# Patient Record
Sex: Female | Born: 1990 | Race: White | Hispanic: No | Marital: Single
Health system: Southern US, Community
[De-identification: ages and names within clinical notes are randomized; demographics above are authoritative.]

---
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
## 2008-11-21 ENCOUNTER — Ambulatory Visit: Payer: Self-pay | Admitting: General Surgery

## 2008-11-30 ENCOUNTER — Ambulatory Visit: Payer: Self-pay | Admitting: General Surgery

## 2008-12-30 ENCOUNTER — Emergency Department: Payer: Self-pay | Admitting: Emergency Medicine

## 2009-01-11 ENCOUNTER — Emergency Department: Payer: Self-pay

## 2009-04-16 ENCOUNTER — Emergency Department: Payer: Self-pay | Admitting: Emergency Medicine

## 2009-04-25 ENCOUNTER — Emergency Department: Payer: Self-pay | Admitting: Emergency Medicine

## 2012-09-22 ENCOUNTER — Emergency Department: Payer: Self-pay | Admitting: Emergency Medicine

## 2012-09-22 LAB — COMPREHENSIVE METABOLIC PANEL
Albumin: 3.2 g/dL — ABNORMAL LOW (ref 3.4–5.0)
Alkaline Phosphatase: 51 U/L (ref 50–136)
Anion Gap: 10 (ref 7–16)
Bilirubin,Total: 0.2 mg/dL (ref 0.2–1.0)
Calcium, Total: 9.1 mg/dL (ref 8.5–10.1)
Chloride: 109 mmol/L — ABNORMAL HIGH (ref 98–107)
Co2: 20 mmol/L — ABNORMAL LOW (ref 21–32)
Creatinine: 0.75 mg/dL (ref 0.60–1.30)
EGFR (African American): >60
EGFR (Non-African Amer.): >60
Glucose: 120 mg/dL — ABNORMAL HIGH (ref 65–99)
Osmolality: 281 (ref 275–301)
Potassium: 3.9 mmol/L (ref 3.5–5.1)
SGOT(AST): 18 U/L (ref 15–37)

## 2012-09-22 LAB — CBC
HCT: 39.4 % (ref 35.0–47.0)
HGB: 13.6 g/dL (ref 12.0–16.0)
MCH: 33.5 pg (ref 26.0–34.0)
MCHC: 34.5 g/dL (ref 32.0–36.0)
MCV: 97 fL (ref 80–100)
RBC: 4.05 10*6/uL (ref 3.80–5.20)

## 2012-09-22 LAB — URINALYSIS, COMPLETE
Bilirubin,UR: NEGATIVE
Blood: NEGATIVE
Glucose,UR: NEGATIVE mg/dL (ref 0–75)
Ketone: NEGATIVE
Nitrite: NEGATIVE
Ph: 6 (ref 4.5–8.0)
Protein: NEGATIVE
Squamous Epithelial: <1
WBC UR: 1 /HPF (ref 0–5)

## 2012-09-22 LAB — DRUG SCREEN, URINE
Cannabinoid 50 Ng, Ur ~~LOC~~: NEGATIVE (ref ?–50)
Cocaine Metabolite,Ur ~~LOC~~: NEGATIVE (ref ?–300)
MDMA (Ecstasy)Ur Screen: NEGATIVE (ref ?–500)
Opiate, Ur Screen: NEGATIVE (ref ?–300)
Tricyclic, Ur Screen: NEGATIVE (ref ?–1000)

## 2012-09-22 LAB — PREGNANCY, URINE: Pregnancy Test, Urine: NEGATIVE m[IU]/mL

## 2012-09-23 LAB — ETHANOL
Ethanol %: <0.003 % (ref 0.000–0.080)
Ethanol: <3 mg/dL

## 2012-10-09 ENCOUNTER — Emergency Department: Payer: Self-pay | Admitting: Emergency Medicine

## 2012-10-10 ENCOUNTER — Emergency Department: Payer: Self-pay | Admitting: Internal Medicine

## 2012-10-10 LAB — URINALYSIS, COMPLETE
Bacteria: NONE SEEN
Bilirubin,UR: NEGATIVE
Blood: NEGATIVE
Glucose,UR: NEGATIVE mg/dL (ref 0–75)
Leukocyte Esterase: NEGATIVE
Nitrite: NEGATIVE
Ph: 7 (ref 4.5–8.0)
Protein: NEGATIVE
RBC,UR: 1 /HPF (ref 0–5)
Squamous Epithelial: NONE SEEN

## 2012-10-10 LAB — DRUG SCREEN, URINE
Amphetamines, Ur Screen: NEGATIVE (ref ?–1000)
Barbiturates, Ur Screen: NEGATIVE (ref ?–200)
Benzodiazepine, Ur Scrn: NEGATIVE (ref ?–200)
MDMA (Ecstasy)Ur Screen: NEGATIVE (ref ?–500)
Methadone, Ur Screen: NEGATIVE (ref ?–300)
Opiate, Ur Screen: NEGATIVE (ref ?–300)
Tricyclic, Ur Screen: NEGATIVE (ref ?–1000)

## 2012-10-10 LAB — ETHANOL: Ethanol: <3 mg/dL

## 2012-10-10 LAB — COMPREHENSIVE METABOLIC PANEL
Alkaline Phosphatase: 60 U/L (ref 50–136)
Anion Gap: 10 (ref 7–16)
BUN: 14 mg/dL (ref 7–18)
Bilirubin,Total: 0.4 mg/dL (ref 0.2–1.0)
Calcium, Total: 9.1 mg/dL (ref 8.5–10.1)
Chloride: 112 mmol/L — ABNORMAL HIGH (ref 98–107)
Co2: 17 mmol/L — ABNORMAL LOW (ref 21–32)
EGFR (African American): >60
Glucose: 102 mg/dL — ABNORMAL HIGH (ref 65–99)
Potassium: 4.5 mmol/L (ref 3.5–5.1)
SGOT(AST): 36 U/L (ref 15–37)
Sodium: 139 mmol/L (ref 136–145)

## 2012-10-10 LAB — SALICYLATE LEVEL: Salicylates, Serum: <1.7 mg/dL

## 2012-10-14 ENCOUNTER — Emergency Department: Payer: Self-pay | Admitting: Emergency Medicine

## 2012-10-14 LAB — CBC
HCT: 39.1 % (ref 35.0–47.0)
MCH: 33.1 pg (ref 26.0–34.0)
MCV: 97 fL (ref 80–100)
RBC: 4.05 10*6/uL (ref 3.80–5.20)
WBC: 11.5 10*3/uL — ABNORMAL HIGH (ref 3.6–11.0)

## 2012-10-14 LAB — COMPREHENSIVE METABOLIC PANEL
Albumin: 3.2 g/dL — ABNORMAL LOW (ref 3.4–5.0)
BUN: 18 mg/dL (ref 7–18)
Bilirubin,Total: 0.2 mg/dL (ref 0.2–1.0)
Calcium, Total: 8.8 mg/dL (ref 8.5–10.1)
Chloride: 112 mmol/L — ABNORMAL HIGH (ref 98–107)
Co2: 18 mmol/L — ABNORMAL LOW (ref 21–32)
Glucose: 94 mg/dL (ref 65–99)
Potassium: 3.7 mmol/L (ref 3.5–5.1)
SGOT(AST): 18 U/L (ref 15–37)
SGPT (ALT): 22 U/L (ref 12–78)
Sodium: 140 mmol/L (ref 136–145)

## 2012-10-14 LAB — DRUG SCREEN, URINE
Amphetamines, Ur Screen: NEGATIVE (ref ?–1000)
Barbiturates, Ur Screen: NEGATIVE (ref ?–200)
Benzodiazepine, Ur Scrn: NEGATIVE (ref ?–200)
Cannabinoid 50 Ng, Ur ~~LOC~~: NEGATIVE (ref ?–50)
MDMA (Ecstasy)Ur Screen: NEGATIVE (ref ?–500)
Methadone, Ur Screen: NEGATIVE (ref ?–300)
Phencyclidine (PCP) Ur S: NEGATIVE (ref ?–25)

## 2012-10-14 LAB — URINALYSIS, COMPLETE
Bilirubin,UR: NEGATIVE
Glucose,UR: NEGATIVE mg/dL (ref 0–75)
Protein: NEGATIVE
WBC UR: 3 /HPF (ref 0–5)

## 2012-10-14 LAB — PREGNANCY, URINE: Pregnancy Test, Urine: NEGATIVE m[IU]/mL

## 2012-10-14 LAB — TSH: Thyroid Stimulating Horm: 5 u[IU]/mL — ABNORMAL HIGH

## 2012-10-22 ENCOUNTER — Emergency Department: Payer: Self-pay | Admitting: Emergency Medicine

## 2012-10-22 LAB — COMPREHENSIVE METABOLIC PANEL
Anion Gap: 8 (ref 7–16)
BUN: 19 mg/dL — ABNORMAL HIGH (ref 7–18)
Bilirubin,Total: 0.2 mg/dL (ref 0.2–1.0)
Calcium, Total: 9.1 mg/dL (ref 8.5–10.1)
Co2: 21 mmol/L (ref 21–32)
EGFR (African American): >60
EGFR (Non-African Amer.): >60
Glucose: 98 mg/dL (ref 65–99)
Osmolality: 283 (ref 275–301)
SGOT(AST): 40 U/L — ABNORMAL HIGH (ref 15–37)
SGPT (ALT): 25 U/L (ref 12–78)
Total Protein: 7.5 g/dL (ref 6.4–8.2)

## 2012-10-22 LAB — DRUG SCREEN, URINE
Amphetamines, Ur Screen: NEGATIVE (ref ?–1000)
Benzodiazepine, Ur Scrn: NEGATIVE (ref ?–200)
Cannabinoid 50 Ng, Ur ~~LOC~~: NEGATIVE (ref ?–50)
Cocaine Metabolite,Ur ~~LOC~~: NEGATIVE (ref ?–300)
MDMA (Ecstasy)Ur Screen: NEGATIVE (ref ?–500)
Methadone, Ur Screen: NEGATIVE (ref ?–300)
Phencyclidine (PCP) Ur S: NEGATIVE (ref ?–25)
Tricyclic, Ur Screen: NEGATIVE (ref ?–1000)

## 2012-10-22 LAB — URINALYSIS, COMPLETE
Bilirubin,UR: NEGATIVE
Glucose,UR: NEGATIVE mg/dL (ref 0–75)
Specific Gravity: 1.023 (ref 1.003–1.030)
Squamous Epithelial: 1

## 2012-10-22 LAB — CBC
MCV: 98 fL (ref 80–100)
RDW: 12.9 % (ref 11.5–14.5)
WBC: 12.5 10*3/uL — ABNORMAL HIGH (ref 3.6–11.0)

## 2012-10-22 LAB — TSH: Thyroid Stimulating Horm: 5.07 u[IU]/mL — ABNORMAL HIGH

## 2012-12-26 ENCOUNTER — Emergency Department: Payer: Self-pay | Admitting: Emergency Medicine

## 2012-12-26 LAB — COMPREHENSIVE METABOLIC PANEL
Albumin: 3.2 g/dL — ABNORMAL LOW (ref 3.4–5.0)
Alkaline Phosphatase: 60 U/L (ref 50–136)
Anion Gap: 11 (ref 7–16)
BUN: 13 mg/dL (ref 7–18)
Bilirubin,Total: 0.2 mg/dL (ref 0.2–1.0)
Calcium, Total: 9 mg/dL (ref 8.5–10.1)
Chloride: 110 mmol/L — ABNORMAL HIGH (ref 98–107)
Co2: 19 mmol/L — ABNORMAL LOW (ref 21–32)
Creatinine: 0.93 mg/dL (ref 0.60–1.30)
EGFR (African American): >60
EGFR (Non-African Amer.): >60
Glucose: 162 mg/dL — ABNORMAL HIGH (ref 65–99)
Osmolality: 283 (ref 275–301)
Potassium: 3.4 mmol/L — ABNORMAL LOW (ref 3.5–5.1)
SGOT(AST): 17 U/L (ref 15–37)
SGPT (ALT): 22 U/L (ref 12–78)
Sodium: 140 mmol/L (ref 136–145)
Total Protein: 6.9 g/dL (ref 6.4–8.2)

## 2012-12-26 LAB — CBC
HCT: 39.2 % (ref 35.0–47.0)
HGB: 13.7 g/dL (ref 12.0–16.0)
MCH: 32.7 pg (ref 26.0–34.0)
MCHC: 34.9 g/dL (ref 32.0–36.0)
MCV: 94 fL (ref 80–100)
Platelet: 166 10*3/uL (ref 150–440)
RBC: 4.19 10*6/uL (ref 3.80–5.20)
RDW: 12.6 % (ref 11.5–14.5)
WBC: 9.9 10*3/uL (ref 3.6–11.0)

## 2012-12-26 LAB — DRUG SCREEN, URINE

## 2012-12-26 LAB — ETHANOL
Ethanol %: <0.003 % (ref 0.000–0.080)
Ethanol: <3 mg/dL

## 2012-12-26 LAB — URINALYSIS, COMPLETE
Bacteria: NONE SEEN
Bilirubin,UR: NEGATIVE
Blood: NEGATIVE
Glucose,UR: NEGATIVE mg/dL (ref 0–75)
Hyaline Cast: 3
Leukocyte Esterase: NEGATIVE
Nitrite: NEGATIVE
Ph: 6 (ref 4.5–8.0)
Protein: 30
RBC,UR: 3 /HPF (ref 0–5)
Specific Gravity: 1.021 (ref 1.003–1.030)
Squamous Epithelial: 2
WBC UR: 4 /HPF (ref 0–5)

## 2012-12-26 LAB — TSH: Thyroid Stimulating Horm: 5.48 u[IU]/mL — ABNORMAL HIGH

## 2012-12-26 LAB — PREGNANCY, URINE: Pregnancy Test, Urine: NEGATIVE m[IU]/mL

## 2013-02-23 ENCOUNTER — Emergency Department: Payer: Self-pay | Admitting: Emergency Medicine

## 2013-02-23 LAB — COMPREHENSIVE METABOLIC PANEL
Albumin: 3.3 g/dL — ABNORMAL LOW (ref 3.4–5.0)
Alkaline Phosphatase: 62 U/L (ref 50–136)
Anion Gap: 7 (ref 7–16)
BUN: 12 mg/dL (ref 7–18)
Bilirubin,Total: 0.1 mg/dL — ABNORMAL LOW (ref 0.2–1.0)
Calcium, Total: 9.1 mg/dL (ref 8.5–10.1)
Chloride: 111 mmol/L — ABNORMAL HIGH (ref 98–107)
Co2: 23 mmol/L (ref 21–32)
Creatinine: 0.8 mg/dL (ref 0.60–1.30)
EGFR (African American): >60
EGFR (Non-African Amer.): >60
Glucose: 109 mg/dL — ABNORMAL HIGH (ref 65–99)
Osmolality: 282 (ref 275–301)
Potassium: 3.7 mmol/L (ref 3.5–5.1)
SGOT(AST): 20 U/L (ref 15–37)
SGPT (ALT): 24 U/L (ref 12–78)
Sodium: 141 mmol/L (ref 136–145)
Total Protein: 7.2 g/dL (ref 6.4–8.2)

## 2013-02-23 LAB — DRUG SCREEN, URINE
Amphetamines, Ur Screen: NEGATIVE (ref ?–1000)
Barbiturates, Ur Screen: NEGATIVE (ref ?–200)
Benzodiazepine, Ur Scrn: NEGATIVE (ref ?–200)
Cannabinoid 50 Ng, Ur ~~LOC~~: NEGATIVE (ref ?–50)
Opiate, Ur Screen: NEGATIVE (ref ?–300)
Phencyclidine (PCP) Ur S: NEGATIVE (ref ?–25)
Tricyclic, Ur Screen: NEGATIVE (ref ?–1000)

## 2013-02-23 LAB — CBC
HCT: 40.5 % (ref 35.0–47.0)
HGB: 13.8 g/dL (ref 12.0–16.0)
MCH: 31.3 pg (ref 26.0–34.0)
MCHC: 33.9 g/dL (ref 32.0–36.0)
MCV: 92 fL (ref 80–100)
Platelet: 190 10*3/uL (ref 150–440)
RBC: 4.4 10*6/uL (ref 3.80–5.20)
RDW: 12.7 % (ref 11.5–14.5)
WBC: 14 10*3/uL — ABNORMAL HIGH (ref 3.6–11.0)

## 2013-02-23 LAB — URINALYSIS, COMPLETE
Ketone: NEGATIVE
Nitrite: NEGATIVE
Ph: 7 (ref 4.5–8.0)
Protein: NEGATIVE
Specific Gravity: 1.02 (ref 1.003–1.030)
WBC UR: 16 /HPF (ref 0–5)

## 2013-02-23 LAB — TSH: Thyroid Stimulating Horm: 6.91 u[IU]/mL — ABNORMAL HIGH

## 2013-02-23 LAB — ETHANOL
Ethanol %: <0.003 % (ref 0.000–0.080)
Ethanol: <3 mg/dL

## 2013-03-11 ENCOUNTER — Emergency Department: Payer: Self-pay | Admitting: Emergency Medicine

## 2013-03-11 LAB — CBC
HGB: 12.6 g/dL (ref 12.0–16.0)
RBC: 4.01 10*6/uL (ref 3.80–5.20)
RDW: 13 % (ref 11.5–14.5)

## 2013-03-11 LAB — DRUG SCREEN, URINE
Barbiturates, Ur Screen: NEGATIVE (ref ?–200)
Cannabinoid 50 Ng, Ur ~~LOC~~: NEGATIVE (ref ?–50)
Cocaine Metabolite,Ur ~~LOC~~: NEGATIVE (ref ?–300)
Methadone, Ur Screen: NEGATIVE (ref ?–300)
Phencyclidine (PCP) Ur S: NEGATIVE (ref ?–25)
Tricyclic, Ur Screen: NEGATIVE (ref ?–1000)

## 2013-03-11 LAB — URINALYSIS, COMPLETE
Blood: NEGATIVE
Glucose,UR: NEGATIVE mg/dL (ref 0–75)
Ketone: NEGATIVE
Leukocyte Esterase: NEGATIVE
Nitrite: NEGATIVE
Ph: 6 (ref 4.5–8.0)
Protein: NEGATIVE
RBC,UR: NONE SEEN /HPF (ref 0–5)
Specific Gravity: 1.023 (ref 1.003–1.030)

## 2013-03-12 LAB — COMPREHENSIVE METABOLIC PANEL
Albumin: 2.9 g/dL — ABNORMAL LOW (ref 3.4–5.0)
Anion Gap: 10 (ref 7–16)
Bilirubin,Total: 0.1 mg/dL — ABNORMAL LOW (ref 0.2–1.0)
Chloride: 112 mmol/L — ABNORMAL HIGH (ref 98–107)
Co2: 22 mmol/L (ref 21–32)
EGFR (African American): >60
Glucose: 126 mg/dL — ABNORMAL HIGH (ref 65–99)
Osmolality: 289 (ref 275–301)
SGPT (ALT): 26 U/L (ref 12–78)
Sodium: 144 mmol/L (ref 136–145)
Total Protein: 6.4 g/dL (ref 6.4–8.2)

## 2013-03-12 LAB — TSH: Thyroid Stimulating Horm: 7.91 u[IU]/mL — ABNORMAL HIGH

## 2013-03-22 ENCOUNTER — Emergency Department: Payer: Self-pay | Admitting: Emergency Medicine

## 2013-03-22 LAB — CBC
HCT: 37.8 % (ref 35.0–47.0)
HGB: 12.9 g/dL (ref 12.0–16.0)
MCH: 31.7 pg (ref 26.0–34.0)
MCHC: 34.1 g/dL (ref 32.0–36.0)
Platelet: 177 10*3/uL (ref 150–440)
RBC: 4.06 10*6/uL (ref 3.80–5.20)
RDW: 12.8 % (ref 11.5–14.5)
WBC: 10 10*3/uL (ref 3.6–11.0)

## 2013-03-22 LAB — COMPREHENSIVE METABOLIC PANEL
Alkaline Phosphatase: 61 U/L (ref 50–136)
Chloride: 112 mmol/L — ABNORMAL HIGH (ref 98–107)
Co2: 19 mmol/L — ABNORMAL LOW (ref 21–32)
EGFR (African American): >60
EGFR (Non-African Amer.): >60
Potassium: 3.6 mmol/L (ref 3.5–5.1)
SGOT(AST): 21 U/L (ref 15–37)
Total Protein: 6.8 g/dL (ref 6.4–8.2)

## 2013-03-22 LAB — TSH: Thyroid Stimulating Horm: 6.29 u[IU]/mL — ABNORMAL HIGH

## 2013-03-23 LAB — DRUG SCREEN, URINE
Amphetamines, Ur Screen: NEGATIVE (ref ?–1000)
Cannabinoid 50 Ng, Ur ~~LOC~~: NEGATIVE (ref ?–50)
Cocaine Metabolite,Ur ~~LOC~~: NEGATIVE (ref ?–300)
MDMA (Ecstasy)Ur Screen: NEGATIVE (ref ?–500)
Methadone, Ur Screen: NEGATIVE (ref ?–300)
Opiate, Ur Screen: NEGATIVE (ref ?–300)
Phencyclidine (PCP) Ur S: NEGATIVE (ref ?–25)

## 2013-03-23 LAB — URINALYSIS, COMPLETE
Bilirubin,UR: NEGATIVE
Glucose,UR: NEGATIVE mg/dL (ref 0–75)
Hyaline Cast: 2
Ketone: NEGATIVE
Leukocyte Esterase: NEGATIVE
Protein: NEGATIVE
Specific Gravity: 1.023 (ref 1.003–1.030)
Squamous Epithelial: 3

## 2013-04-01 ENCOUNTER — Emergency Department: Payer: Self-pay | Admitting: Emergency Medicine

## 2013-04-01 LAB — ETHANOL
Ethanol %: <0.003 % (ref 0.000–0.080)
Ethanol: <3 mg/dL

## 2013-04-01 LAB — COMPREHENSIVE METABOLIC PANEL
Albumin: 2.9 g/dL — ABNORMAL LOW (ref 3.4–5.0)
Alkaline Phosphatase: 70 U/L (ref 50–136)
Bilirubin,Total: 0.1 mg/dL — ABNORMAL LOW (ref 0.2–1.0)
Co2: 20 mmol/L — ABNORMAL LOW (ref 21–32)
Creatinine: 0.75 mg/dL (ref 0.60–1.30)
EGFR (African American): >60
EGFR (Non-African Amer.): >60
Glucose: 126 mg/dL — ABNORMAL HIGH (ref 65–99)
Osmolality: 280 (ref 275–301)
SGPT (ALT): 47 U/L (ref 12–78)
Total Protein: 7 g/dL (ref 6.4–8.2)

## 2013-04-01 LAB — DRUG SCREEN, URINE
Amphetamines, Ur Screen: NEGATIVE (ref ?–1000)
Benzodiazepine, Ur Scrn: NEGATIVE (ref ?–200)
Opiate, Ur Screen: NEGATIVE (ref ?–300)
Phencyclidine (PCP) Ur S: NEGATIVE (ref ?–25)
Tricyclic, Ur Screen: NEGATIVE (ref ?–1000)

## 2013-04-01 LAB — T4, FREE: Free Thyroxine: 0.85 ng/dL (ref 0.76–1.46)

## 2013-04-01 LAB — TSH: Thyroid Stimulating Horm: 6.33 u[IU]/mL — ABNORMAL HIGH

## 2013-04-01 LAB — SALICYLATE LEVEL: Salicylates, Serum: <1.7 mg/dL

## 2013-04-01 LAB — CBC
HCT: 40 % (ref 35.0–47.0)
MCHC: 34.2 g/dL (ref 32.0–36.0)
Platelet: 179 10*3/uL (ref 150–440)
RBC: 4.32 10*6/uL (ref 3.80–5.20)

## 2013-04-01 LAB — ACETAMINOPHEN LEVEL: Acetaminophen: <2 ug/mL

## 2013-12-05 ENCOUNTER — Emergency Department: Payer: Self-pay | Admitting: Emergency Medicine

## 2013-12-05 LAB — URINALYSIS, COMPLETE
BACTERIA: NONE SEEN
BILIRUBIN, UR: NEGATIVE
Blood: NEGATIVE
Glucose,UR: NEGATIVE mg/dL (ref 0–75)
Leukocyte Esterase: NEGATIVE
Nitrite: NEGATIVE
Ph: 6 (ref 4.5–8.0)
Protein: NEGATIVE
Specific Gravity: 1.019 (ref 1.003–1.030)
Squamous Epithelial: 6

## 2013-12-05 LAB — COMPREHENSIVE METABOLIC PANEL
ALK PHOS: 53 U/L
ALT: 39 U/L (ref 12–78)
AST: 29 U/L (ref 15–37)
Albumin: 2.9 g/dL — ABNORMAL LOW (ref 3.4–5.0)
Anion Gap: 7 (ref 7–16)
BUN: 11 mg/dL (ref 7–18)
Bilirubin,Total: 0.3 mg/dL (ref 0.2–1.0)
CALCIUM: 9.3 mg/dL (ref 8.5–10.1)
CHLORIDE: 116 mmol/L — AB (ref 98–107)
CO2: 21 mmol/L (ref 21–32)
Creatinine: 0.85 mg/dL (ref 0.60–1.30)
EGFR (African American): >60
EGFR (Non-African Amer.): >60
GLUCOSE: 98 mg/dL (ref 65–99)
Osmolality: 286 (ref 275–301)
POTASSIUM: 3.9 mmol/L (ref 3.5–5.1)
Sodium: 144 mmol/L (ref 136–145)
Total Protein: 6.7 g/dL (ref 6.4–8.2)

## 2013-12-05 LAB — ETHANOL
Ethanol %: <0.003 % (ref 0.000–0.080)
Ethanol: <3 mg/dL

## 2013-12-05 LAB — DRUG SCREEN, URINE
AMPHETAMINES, UR SCREEN: NEGATIVE (ref ?–1000)
Barbiturates, Ur Screen: NEGATIVE (ref ?–200)
Benzodiazepine, Ur Scrn: NEGATIVE (ref ?–200)
Cannabinoid 50 Ng, Ur ~~LOC~~: NEGATIVE (ref ?–50)
Cocaine Metabolite,Ur ~~LOC~~: NEGATIVE (ref ?–300)
MDMA (ECSTASY) UR SCREEN: NEGATIVE (ref ?–500)
Methadone, Ur Screen: NEGATIVE (ref ?–300)
OPIATE, UR SCREEN: NEGATIVE (ref ?–300)
Phencyclidine (PCP) Ur S: NEGATIVE (ref ?–25)
TRICYCLIC, UR SCREEN: NEGATIVE (ref ?–1000)

## 2013-12-05 LAB — TSH: THYROID STIMULATING HORM: 2.97 u[IU]/mL

## 2013-12-05 LAB — CBC
HCT: 40.1 % (ref 35.0–47.0)
HGB: 13.2 g/dL (ref 12.0–16.0)
MCH: 32.9 pg (ref 26.0–34.0)
MCHC: 33 g/dL (ref 32.0–36.0)
MCV: 100 fL (ref 80–100)
Platelet: 135 10*3/uL — ABNORMAL LOW (ref 150–440)
RBC: 4.03 10*6/uL (ref 3.80–5.20)
RDW: 14 % (ref 11.5–14.5)
WBC: 7.9 10*3/uL (ref 3.6–11.0)

## 2013-12-05 LAB — PREGNANCY, URINE: Pregnancy Test, Urine: NEGATIVE m[IU]/mL

## 2013-12-05 LAB — TROPONIN I: Troponin-I: <0.02 ng/mL

## 2013-12-06 LAB — VALPROIC ACID LEVEL: Valproic Acid: 86 ug/mL
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
## 2014-09-15 NOTE — Consult Note (Signed)
Brief Consult Note: Diagnosis: intermittant explosive disorder.   Patient was seen by consultant.   Consult note dictated.   Discussed with Attending MD.   Comments: Psychiatry: Patient seen. Patient with mental retardation and intermittant explosive behavior. Has appropriate medication and treatment. Currently calm and not threatening suicide or to hurt self or others. No indication for hospital tretment. Group home willing to take her back. Will discharge patient home to outpt care.  Electronic Signatures: Audery Amellapacs, Lennan Malone T (MD)  (Signed 23-May-14 14:43)  Authored: Brief Consult Note   Last Updated: 23-May-14 14:43 by Audery Amellapacs, Jaquesha Boroff T (MD)
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
## 2014-09-15 NOTE — Consult Note (Signed)
Brief Consult Note: Diagnosis: Intermittent explosive disorder, severe MR.   Patient was seen by consultant.   Consult note dictated.   Recommend further assessment or treatment.   Orders entered.   Discussed with Attending MD.   Comments: Ms. [name] has a h/o MR and behavioral problems. She was brought to ER after she became agitated at the group home and run out. She is cool and collected here. No toughts of hurting herself or others.  PLAN: 1. The patient no longer meets criteria for IVC. I will terminate proceedings. Please discharge as appropriate.  2. No medication changes. No Rx necessary.  3. She will follow up with Dr. Ave Filterhandler.   4. Her group home will pick her up..  Electronic Signatures: Kristine LineaPucilowska, Yovany Clock (MD)  (Signed 20-Oct-14 11:33)  Authored: Brief Consult Note   Last Updated: 20-Oct-14 11:33 by Kristine LineaPucilowska, Coulson Wehner (MD)

## 2014-09-15 NOTE — Consult Note (Signed)
PATIENT NAME:  Jennifer Gibbs, NORDSTROM MR#:  [id number] DATE OF BIRTH:  12-07-1990  DATE OF ADMISSION: November the 7th  DATE OF CONSULTATION:  November the 10th  REFERRING PHYSICIAN:  Janalyn Harder, MD  CONSULTING PHYSICIAN:  Jessi Pitstick B. Anitha Kreiser, MD  REASON FOR CONSULTATION: To evaluate a suicidal patient.   IDENTIFYING DATA: Ms. Risk is a 24 year old female with history of mental retardation and intermittent explosive disorder.   CHIEF COMPLAINT: "[name] was mean to me."   HISTORY OF PRESENT ILLNESS: [name] resides in a group home. She frequently comes to the Emergency Room for agitated behavior. At this time, the patient instead of $25 was given only $5 at the beginning of the month. This is due to some bad behavior in the past that is being punished by cutting her allowance. Money is very important to everybody but especially to Ms. Kluver who likes to spend her small allowance on phone calls. She became upset with her 1-to-1 staff member, Elmarie Shiley. [name] was then trying to take the patient to meet with her case manager and the patient became agitated in the car. Safety locks were not on, so the patient was able to jump out of the running car and started running into traffic. She was followed by her caretaker. None of them got injured, but the patient was brought to the Emergency Room for presumed suicide attempt. The patient denies feeling suicidal but she does  feel frustrated, especially that she does not get along with her 1-on-1. The patient has a long history of behavioral problems. She runs away, jumps out of the car. She has a history of cutting and history of aggressive behavior and property destruction. This incident seems rather small comparing to other incidents.   It is characteristic that oftentimes these incidents happen on the weekend when alternative staff members are in charge. The patient denies any depression, anxiety, psychosis. There are no symptoms suggestive of  bipolar mania. There are no substances involved.   PAST PSYCHIATRIC HISTORY: There were many psychiatric hospitalizations, but recently she has been able to stay away from the hospital with proper care provided by her group home. She works with [employer]. As above, her problems at the group home stem from poor coping skills and limited intellectual capacity rather than from true mental illness.   FAMILY PSYCHIATRIC HISTORY: Unknown.  PAST MEDICAL HISTORY: Asthma, bed wetting.   ALLERGIES: No known drug allergies.   MEDICATIONS ON ADMISSION: Zyrtec 10 mg; clonidine 0.5 mg in the morning, 0.1 at bedtime; desmopressin 0.5 mg at night; Depakote 1500 at bedtime; Prozac 10 mg daily; folic acid 1 mg daily; furosemide 20 mg daily; multivitamin daily; naproxen 250 mg twice daily; Zyprexa 2.5 mg twice daily; Topamax 200 mg daily.   SOCIAL HISTORY: She is originally from [place]. She has never been married and has no children. She was diagnosed with mental retardation and is a resident at the group home. She is an incompetent adult and has a guardian.   REVIEW OF SYSTEMS:  CONSTITUTIONAL: No fevers or chills. No weight changes.  EYES: No double or blurred vision.  ENT: No hearing loss.  RESPIRATORY: No shortness of breath or cough.  CARDIOVASCULAR: No chest pain or orthopnea.  GASTROINTESTINAL: No abdominal pain, nausea, vomiting, or diarrhea.  GENITOURINARY: No incontinence or frequency.  ENDOCRINE: No heat or cold intolerance.  LYMPHATIC: No anemia or easy bruising.  INTEGUMENTARY: No acne or rash.  MUSCULOSKELETAL: No muscle or joint pain.  NEUROLOGIC: No  tingling or weakness.  PSYCHIATRIC: See history of present illness for details.   PHYSICAL EXAMINATION:  VITAL SIGNS: Blood pressure 140/90, pulse 105, respirations 20, temperature 98.  GENERAL: A slightly obese female in no acute distress.   The rest of the physical examination is deferred to her primary attending.   LABORATORY DATA:  Chemistries are within normal limits except for blood glucose of 126. Blood alcohol level is zero. LFTs within normal limits except for AST of 43. TSH 6.33 with normal thyroxine of 0.85. Urine tox screen negative for substances. CBC within normal limits. Serum acetaminophen and salicylates are low.   MENTAL STATUS EXAMINATION: The patient is alert and oriented to person, place, time and situation. She is still claiming that Elmarie Shileyiffany was unkind to her, precipitating current event. She recognizes me from previous admission. She is very friendly. She is wearing hospital scrubs and a yellow shirt. She is well groomed and she maintains good eye contact. Her speech is slurred from speech impediment, but by now I got used to it and understand her pretty well. Mood is good with mad affect. Thought process is slow. She denies suicidal or homicidal ideation. She explained that she was not trying to put herself in danger; rather, after she jumped out of the car, she was heading towards her group home, wanted to return home. There are no thoughts of hurting others. There are no delusions or paranoia. There are no auditory or visual hallucinations. Her cognition is grossly intact. Her insight and judgment are poor.   DIAGNOSES:  AXIS I: Intermittent explosive disorder.  AXIS II: Moderate mental retardation.  AXIS III: Asthma, obesity.  AXIS IV: Cognitive difficulties, poor coping skills, poor insight into mental illness, primary support.  AXIS V: Global Assessment of Functioning, 45.   PLAN:  1.  The patient no longer meets criteria for involuntary inpatient psychiatric commitment. I will terminate proceedings. Please discharge as appropriate.  2.  The patient is to continue all her medications as in the community. No prescription necessary. The patient will follow up with Dr. Ave Filterhandler, her primary psychiatrist.  3.  Her group home staff member will pick her up from the Emergency Room.    ____________________________ Ellin GoodieJolanta B. Jennet MaduroPucilowska, MD jbp:np D: 04/05/2013 16:10:54 ET T: 04/05/2013 16:50:56 ET JOB#: [id number]  cc: Bernis Schreur B. Jennet MaduroPucilowska, MD, <Dictator> Shari ProwsJOLANTA B Trevion Hoben MD ELECTRONICALLY SIGNED 04/08/2013 20:44

## 2014-09-15 NOTE — Consult Note (Signed)
Brief Consult Note: Diagnosis: Intermittant explosive disorder.   Patient was seen by consultant.   Discussed with Attending MD.   Comments: Patient seen in ED B[facility]. Patient has h/o  mental retardation and intermittant explosive behavior. She was admitted due to conflict at group home. She now realized her behavior and is doing well. She is compliant with her medications and is willing to go back. Currently calm and not threatening suicide or to hurt self or others.Group home willing to take her back. Will discharge patient home to outpt care.She will follow up with Dr Ave Filterhandler.  Electronic Signatures: Rhunette CroftFaheem, Darrelle Wiberg S (MD)  (Signed 03-Jun-14 10:45)  Authored: Brief Consult Note   Last Updated: 03-Jun-14 10:45 by Rhunette CroftFaheem, Nagi Furio S (MD)

## 2014-09-15 NOTE — Consult Note (Signed)
Brief Consult Note: Diagnosis: Intermittent explosive disorder, severe MR.   Patient was seen by consultant.   Consult note dictated.   Recommend further assessment or treatment.   Discussed with Attending MD.   Comments: Jennifer Gibbs has a h/o MR and behavioral problems. She was brought to ER after she became agitated at the group home about a phone call. She is cool and collected. No stoughts of hurting herself or others.  PLAN: 1. The patient no longer meets criteria for IVC. I will terminate proceedings. Please discharge as appropriate.  2. No medication changes.   3. She will follow up with Dr. Ave Filterhandler on 08/18 at 9:00 am.  4. Her group home will pick her up at 17:00.  Electronic Signatures: Kristine LineaPucilowska, Yakov Bergen (MD)  (Signed 04-Aug-14 15:30)  Authored: Brief Consult Note   Last Updated: 04-Aug-14 15:30 by Kristine LineaPucilowska, Britanee Vanblarcom (MD)

## 2014-09-16 NOTE — Consult Note (Signed)
PATIENT NAME:  Jennifer Gibbs, Jennifer Gibbs MR#:  [id number] DATE OF BIRTH:  [birth year]  DATE OF CONSULTATION:  12/06/2013  REFERRING PHYSICIAN:  Maurilio Lovely, MD. CONSULTING PHYSICIAN:  Ardeen Fillers. Garnetta Buddy, MD  REASON FOR CONSULTATION: "Ms. Deanna Artis called the police because I want to leave."   HISTORY OF PRESENT ILLNESS: Patient is a 24 year old single female who currently lives at [facility] group home who presented to the ER after she got upset and was unable to describe in detail the events which led to her admission. The police were called and the patient was advised to go back to her group home, but she told the police department that she wants to come to the hospital for evaluation. She was taken to the [facility] where she appeared to have a fainting episode. Ambulance was called and patient was brought to the ER. This patient has a history of mental retardation with a full scale IQ of 37.   During my interview, the patient reported that she got upset at the group home staff as the adult  from the group home was not nice to her and was pulling her. She was trying to show me some scars on her hand. She reported that she follows with her psychiatrist, and takes her medications; however, she reported that the medications do not help her. The patient currently denied having any suicidal ideations or plans. She reported that she does not feel mad or angry most of the time. She has history of impulsive behavior in the past and she was last in the hospital in November. The patient is compliant with her medications; however, the staff at the group home reported that the Hinda Glatter is currently not helping her at this time.   PAST PSYCHIATRIC HISTORY: The patient has a history of multiple psychiatric hospitalizations in the past, but she has been able to maintain herself on her current psychotropic medication. She occasionally works at [employer]. She has poor coping skills and limited intellectual capacity.   FAMILY  HISTORY: Unknown.  PAST MEDICAL HISTORY: Asthma and history of bedwetting.   ALLERGIES: NO KNOWN DRUG ALLERGIES.   CURRENT PSYCHOTROPIC MEDICATIONS:   2.  Folic acid 1 mg daily.  3.  Depakote sodium 3 tablets at bedtime. 6.  Clonidine 0.25 mg b.i.d. 7.  Cetirizine 10 mg daily.   SOCIAL HISTORY: She is originally from [place] and has never been married and has no children. She was diagnosed with mental retardation and is currently a resident at the group home. She does have a guardian.   VITAL SIGNS ON ADMISSION:  Temperature 97.5, pulse 68, respirations 18, blood pressure 117/68.   REVIEW OF SYSTEMS: The patient currently denies any fever or chills. No weight changes.  EYES: No double or blurred. EAR, NOSE, AND THROAT:  No hearing loss.   RESPIRATORY:  No shortness of breath or cough.  CARDIOVASCULAR: No chest pain or orthopnea.  GASTROINTESTINAL:  No abdominal pain, nausea, vomiting, or diarrhea.  GENITOURINARY: No incontinence or frequency.  ENDOCRINE: No heat or cold intolerance.  LYMPHATIC: No anemia or easy bruising.  INTEGUMENTARY: No acne or rash.  MUSCULOSKELETAL: No muscle or joint pain.  NEUROLOGIC: No tingling or weakness.   PHYSICAL EXAMINATION:  VITAL SIGNS: Temperature 97.5, pulse 68, respirations 18, blood pressure 117/68.   MENTAL STATUS EXAMINATION: The patient is a short statured female who appeared her stated age. She was awake, alert, and oriented x 3. She reported that the staff was unkind to her.  She maintained fair eye contact. She was friendly and cooperative. Her speech was low in tone and volume. Thought process tangential. Thought content was nondelusional. She currently denied having any suicidal ideations or plans. She denied having any perceptual disturbances. Her memory appeared intact. She appeared to be of limited intellectual capacity. Her cognition is intact.   DIAGNOSTIC IMPRESSION:  AXIS I:  1.  Intermittent explosive disorder.  2.  Moderate  mental retardation.  AXIS III: Asthma, and obesity.  AXIS IV: Severe cognitive disabilities and poor coping skills. AXIS V: Current global assessment of functioning 45.   PLAN:  1.  The patient is not on involuntary commitment at this time and will be discharged as appropriate.  2.  I will change her medications will start her on Zyprexa 2.5 mg p.o. b.i.d.  3.  She will not continue on Invega at this time. She will continue on other medications as prescribed. She will follow up with her primary psychiatrist,at her group home. Staff will pick up from the Emergency Room.   Thank you for allowing me to participate in the care of this patient.     ____________________________ Ardeen FillersUzma S. Garnetta BuddyFaheem, MD usf:ts D: 12/06/2013 10:57:17 ET T: 12/06/2013 11:24:41 ET JOB#: [id number]  cc: Ardeen FillersUzma S. Garnetta BuddyFaheem, MD, <Dictator> Rhunette CroftUZMA S Kanitra Purifoy MD ELECTRONICALLY SIGNED 12/13/2013 13:05

## 2014-12-05 IMAGING — CR DG TIBIA/FIBULA 2V*L*
1 series · 2 of 2 positions shown · non-contrast
Comparison: none

REASON FOR EXAM: pain
COMMENTS:   May transport without cardiac monitor

PROCEDURE:     DXR - DXR TIBIA AND FIBULA LT (LOWER L  - September 22, 2012 [DATE]
RESULT:     Comparison: None.

[Series 1: x tib-fib ap left · 0.14mm/px · 2 of 2 slices shown]
[im 1/2]
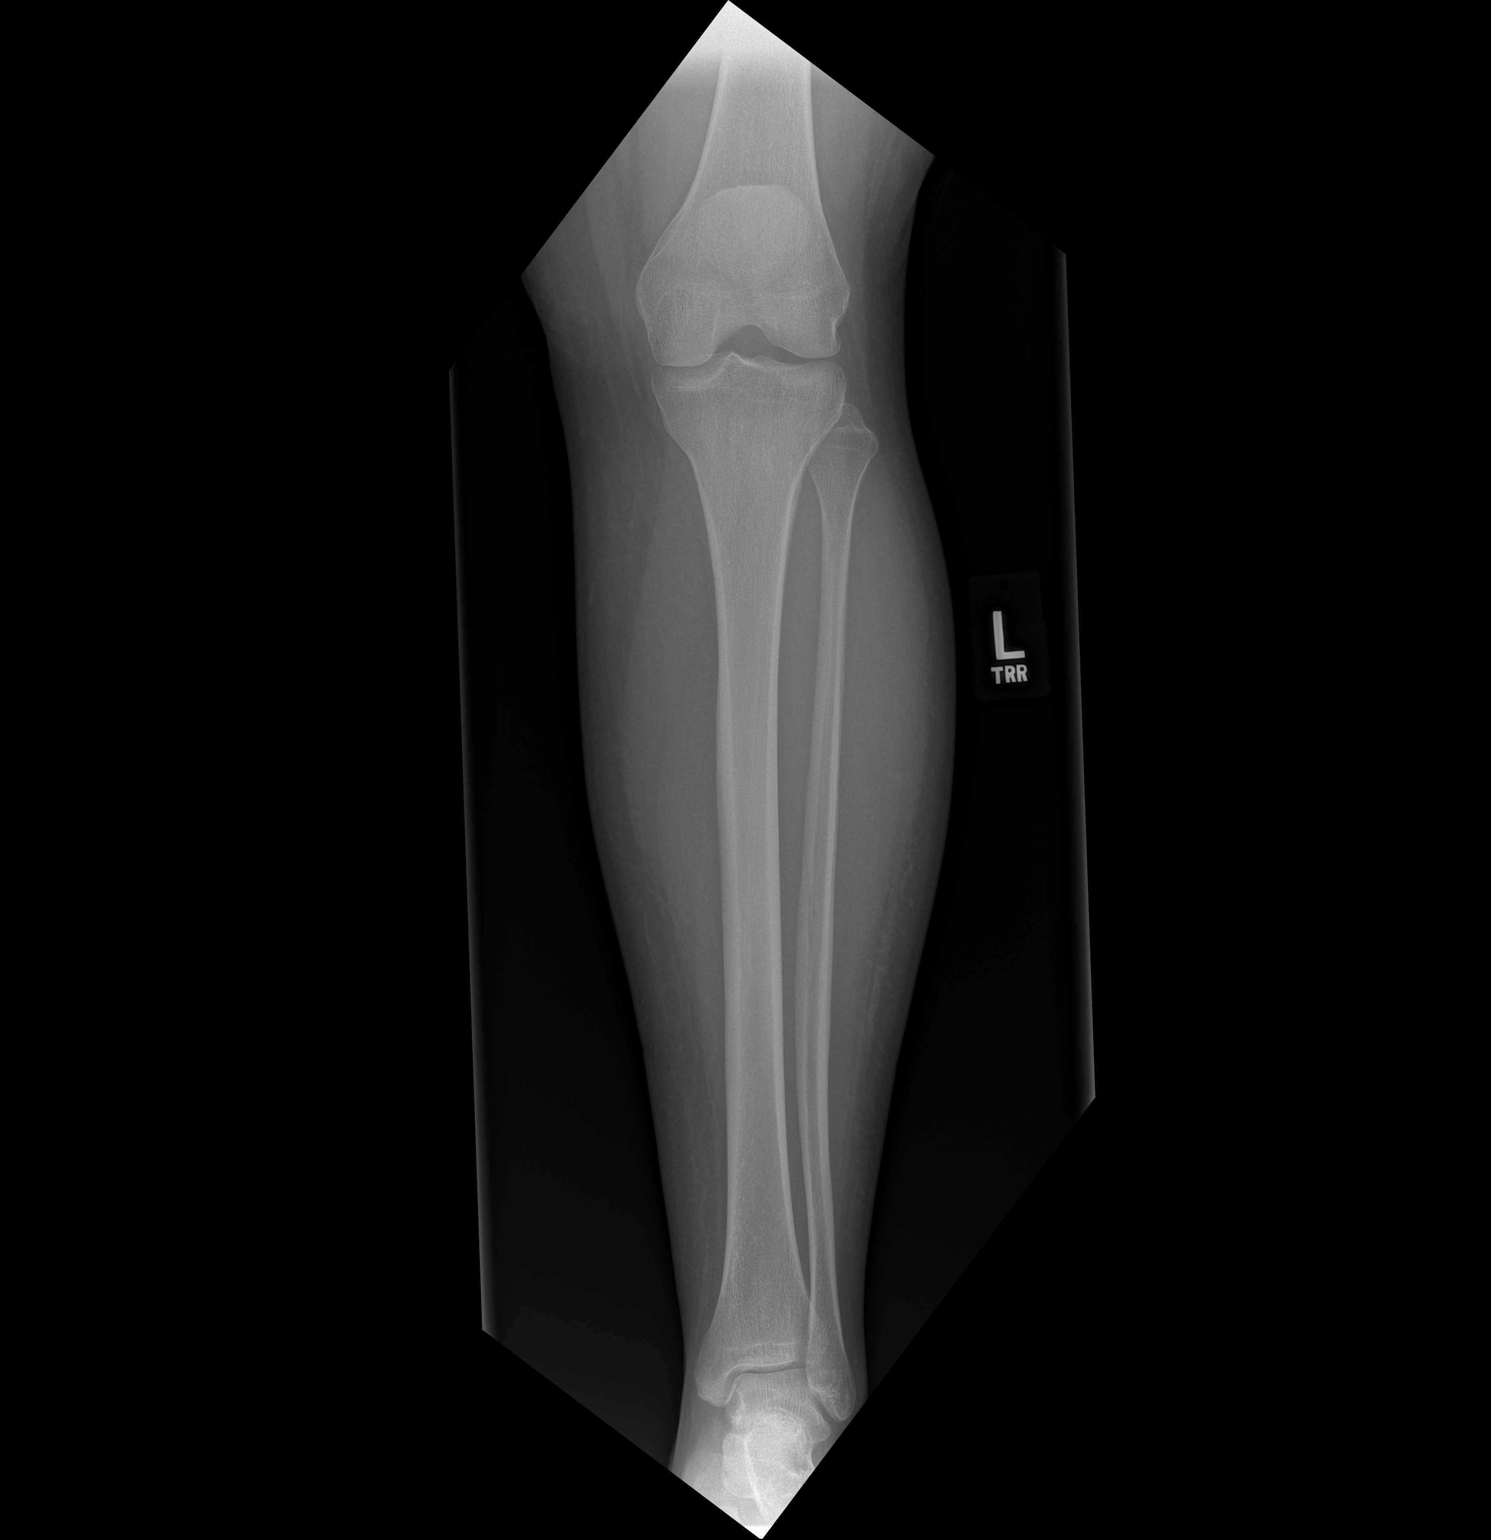
[im 2/2]
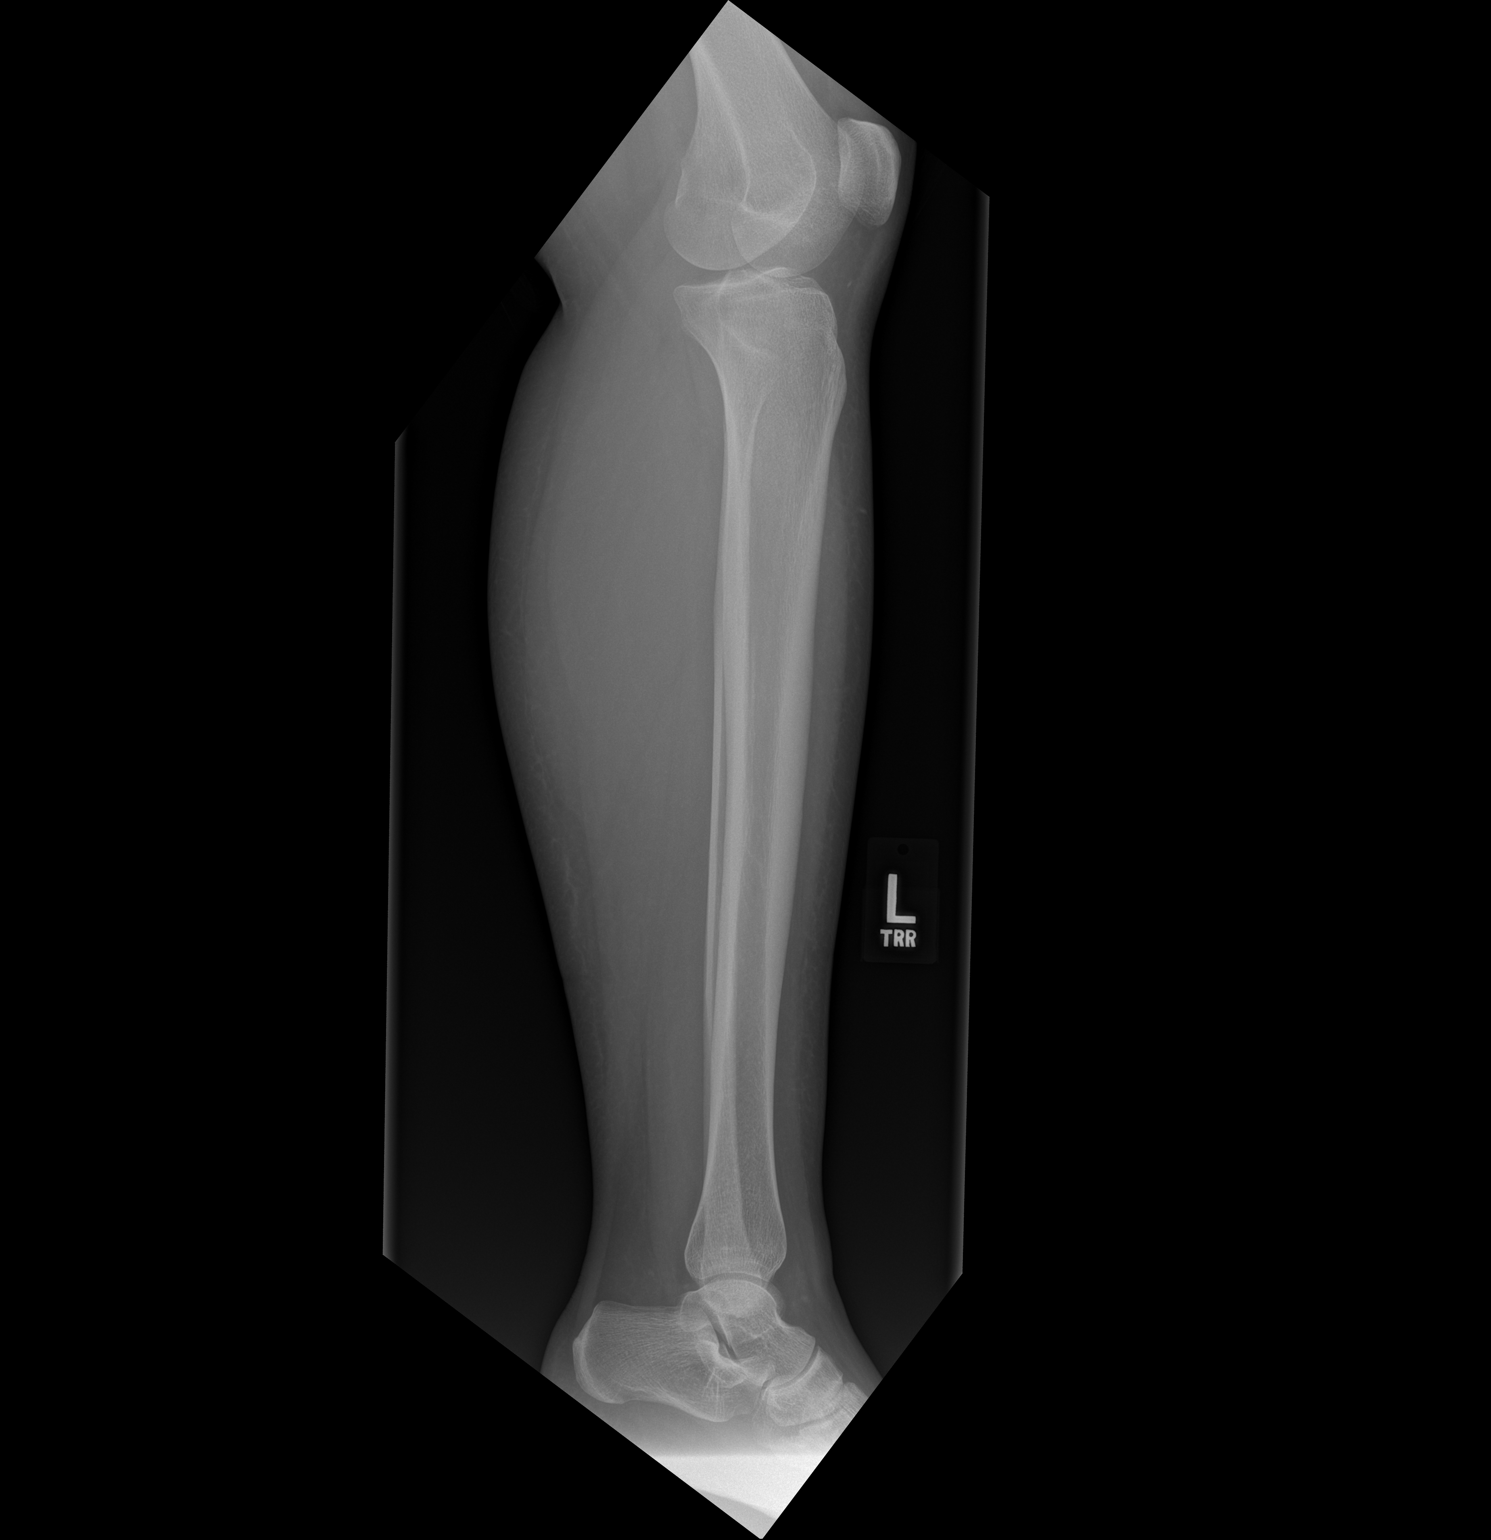

[2 of 2 positions shown; findings below may reference images not displayed]

FINDINGS: No acute fracture. No periostitis.
IMPRESSION: No acute fracture.

[REDACTED]

## 2019-07-31 ENCOUNTER — Ambulatory Visit: Payer: Medicaid Other | Attending: Internal Medicine

## 2019-07-31 DIAGNOSIS — Z23 Encounter for immunization: Secondary | ICD-10-CM | POA: Insufficient documentation

## 2019-07-31 NOTE — Progress Notes (Signed)
   Covid-19 Vaccination Clinic  Name:  JEWEL MCAFEE    MRN: [id number] DOB: 01-Oct-1990  07/31/2019  Ms. Hagwood was observed post Covid-19 immunization for 15 minutes without incident. She was provided with Vaccine Information Sheet and instruction to access the V-Safe system.   Ms. Heyne was instructed to call 911 with any severe reactions post vaccine: Marland Kitchen Difficulty breathing  . Swelling of face and throat  . A fast heartbeat  . A bad rash all over body  . Dizziness and weakness   Immunizations Administered    Name Date Dose VIS Date Route   Pfizer COVID-19 Vaccine 07/31/2019  9:27 AM 0.3 mL 05/06/2019 Intramuscular   Manufacturer: [employer], [employer]   Lot: [id number]   NDC: [address]-1

## 2019-08-23 ENCOUNTER — Ambulatory Visit: Payer: Medicaid Other | Attending: Internal Medicine

## 2019-08-23 DIAGNOSIS — Z23 Encounter for immunization: Secondary | ICD-10-CM

## 2019-08-23 NOTE — Progress Notes (Signed)
   Covid-19 Vaccination Clinic  Name:  AUTYMN OMLOR    MRN: [id number] DOB: 1990/10/16  08/23/2019  Ms. Gettinger was observed post Covid-19 immunization for 15 minutes without incident. She was provided with Vaccine Information Sheet and instruction to access the V-Safe system.   Ms. Bures was instructed to call 911 with any severe reactions post vaccine: Marland Kitchen Difficulty breathing  . Swelling of face and throat  . A fast heartbeat  . A bad rash all over body  . Dizziness and weakness   Immunizations Administered    Name Date Dose VIS Date Route   Pfizer COVID-19 Vaccine 08/23/2019 10:13 AM 0.3 mL 05/06/2019 Intramuscular   Manufacturer: [employer], [employer]   Lot: [id number]   NDC: [address]-2

## 2023-01-20 ENCOUNTER — Emergency Department: Payer: MEDICAID

## 2023-01-20 ENCOUNTER — Other Ambulatory Visit: Payer: Self-pay

## 2023-01-20 ENCOUNTER — Emergency Department
Admission: EM | Admit: 2023-01-20 | Discharge: 2023-01-20 | Disposition: A | Discharge status: Home / Self Care | Payer: MEDICAID | Attending: Emergency Medicine | Admitting: Emergency Medicine

## 2023-01-20 DIAGNOSIS — R638 Other symptoms and signs concerning food and fluid intake: Secondary | ICD-10-CM | POA: Insufficient documentation

## 2023-01-20 DIAGNOSIS — R531 Weakness: Secondary | ICD-10-CM | POA: Diagnosis not present

## 2023-01-20 DIAGNOSIS — R110 Nausea: Secondary | ICD-10-CM | POA: Diagnosis present

## 2023-01-20 DIAGNOSIS — R0602 Shortness of breath: Secondary | ICD-10-CM | POA: Diagnosis not present

## 2023-01-20 DIAGNOSIS — R55 Syncope and collapse: Secondary | ICD-10-CM | POA: Insufficient documentation

## 2023-01-20 DIAGNOSIS — R519 Headache, unspecified: Secondary | ICD-10-CM | POA: Insufficient documentation

## 2023-01-20 DIAGNOSIS — R1084 Generalized abdominal pain: Secondary | ICD-10-CM | POA: Insufficient documentation

## 2023-01-20 LAB — URINALYSIS, W/ REFLEX TO CULTURE (INFECTION SUSPECTED)
Bilirubin Urine: NEGATIVE
Glucose, UA: NEGATIVE mg/dL
Ketones, ur: 20 mg/dL — AB
Leukocytes,Ua: NEGATIVE
Nitrite: NEGATIVE
Protein, ur: 30 mg/dL — AB
Specific Gravity, Urine: 1.028 (ref 1.005–1.030)
pH: 5 (ref 5.0–8.0)

## 2023-01-20 LAB — BASIC METABOLIC PANEL
Anion gap: 12 (ref 5–15)
BUN: 17 mg/dL (ref 6–20)
CO2: 15 mmol/L — ABNORMAL LOW (ref 22–32)
Calcium: 9.2 mg/dL (ref 8.9–10.3)
Chloride: 112 mmol/L — ABNORMAL HIGH (ref 98–111)
Creatinine, Ser: 0.98 mg/dL (ref 0.44–1.00)
GFR, Estimated: >60 mL/min (ref >60)
Glucose, Bld: 123 mg/dL — ABNORMAL HIGH (ref 70–99)
Potassium: 3.9 mmol/L (ref 3.5–5.1)
Sodium: 139 mmol/L (ref 135–145)

## 2023-01-20 LAB — CBC
HCT: 42.5 % (ref 36.0–46.0)
Hemoglobin: 13.9 g/dL (ref 12.0–15.0)
MCH: 31.1 pg (ref 26.0–34.0)
MCHC: 32.7 g/dL (ref 30.0–36.0)
MCV: 95.1 fL (ref 80.0–100.0)
Platelets: 187 10*3/uL (ref 150–400)
RBC: 4.47 MIL/uL (ref 3.87–5.11)
RDW: 13.7 % (ref 11.5–15.5)
WBC: 9.8 10*3/uL (ref 4.0–10.5)
nRBC: 0 % (ref 0.0–0.2)

## 2023-01-20 LAB — HEPATIC FUNCTION PANEL
ALT: 19 U/L (ref 0–44)
AST: 21 U/L (ref 15–41)
Albumin: 3.4 g/dL — ABNORMAL LOW (ref 3.5–5.0)
Alkaline Phosphatase: 41 U/L (ref 38–126)
Bilirubin, Direct: <0.1 mg/dL (ref 0.0–0.2)
Total Bilirubin: 0.8 mg/dL (ref 0.3–1.2)
Total Protein: 6.9 g/dL (ref 6.5–8.1)

## 2023-01-20 LAB — POC URINE PREG, ED: Preg Test, Ur: NEGATIVE

## 2023-01-20 LAB — VALPROIC ACID LEVEL: Valproic Acid Lvl: 53 ug/mL (ref 50.0–100.0)

## 2023-01-20 LAB — LIPASE, BLOOD: Lipase: 59 U/L — ABNORMAL HIGH (ref 11–51)

## 2023-01-20 MED ORDER — MORPHINE SULFATE (PF) 4 MG/ML IV SOLN
4.0000 mg | Freq: Once | INTRAVENOUS | Status: AC
  Administered 2023-01-20: 4 mg via INTRAVENOUS
  Filled 2023-01-20: qty 1

## 2023-01-20 MED ORDER — SODIUM CHLORIDE 0.9 % IV BOLUS
1000.0000 mL | Freq: Once | INTRAVENOUS | Status: AC
  Administered 2023-01-20: 1000 mL via INTRAVENOUS

## 2023-01-20 MED ORDER — ONDANSETRON HCL 4 MG PO TABS: 4.0000 mg | ORAL_TABLET | Freq: Four times a day (QID) | ORAL | 0 refills | Status: AC | PRN

## 2023-01-20 MED ORDER — IOHEXOL 350 MG/ML SOLN
100.0000 mL | Freq: Once | INTRAVENOUS | Status: AC | PRN
  Administered 2023-01-20: 100 mL via INTRAVENOUS

## 2023-01-20 MED ORDER — ONDANSETRON HCL 4 MG/2ML IJ SOLN
4.0000 mg | Freq: Once | INTRAMUSCULAR | Status: AC
  Administered 2023-01-20: 4 mg via INTRAVENOUS
  Filled 2023-01-20: qty 2

## 2023-01-20 NOTE — ED Notes (Signed)
Pt transported to CT at this time.

## 2023-01-20 NOTE — Discharge Instructions (Addendum)
Your testing today was reassuring.  I suspect that you may have been dehydrated.  Please make sure you are eating and drinking regularly.  I sent a prescription for nausea medicine.  You can take this as needed.  Return to the ER for new or worsening symptoms.

## 2023-01-20 NOTE — ED Provider Notes (Signed)
[facility] Provider Note    Event Date/Time   First MD Initiated Contact with Patient 01/20/23 1548     (approximate)   History   Fall and Abdominal Pain   HPI  Jennifer Gibbs is a 32 y.o. female with history of bipolar disorder, seizure disorder, cognitive deficit presenting to the emergency department for evaluation of weakness.  Patient currently resides in a group home.  Worker from facility reports that yesterday patient stated that she was not feeling well.  Today, appeared generally weak and [place] had a witnessed episode where she collapsed to the ground.  Unknown LOC.  Patient reports generalized headache as well as shortness of breath and generalized abdominal pain.  Additionally reports nausea with poor p.o. intake.  Denies history of similar.      Physical Exam   Triage Vital Signs: ED Triage Vitals  Encounter Vitals Group     BP 01/20/23 1402 105/78     Systolic BP Percentile --      Diastolic BP Percentile --      Pulse Rate 01/20/23 1402 (!) 132     Resp 01/20/23 1402 20     Temp 01/20/23 1405 99.3 F (37.4 C)     Temp src --      SpO2 01/20/23 1404 93 %     Weight 01/20/23 1403 180 lb (81.6 kg)     Height --      Head Circumference --      Peak Flow --      Pain Score 01/20/23 1402 6     Pain Loc --      Pain Education --      Exclude from Growth Chart --     Most recent vital signs: Vitals:   01/20/23 1848 01/20/23 1900  BP: 115/86 113/85  Pulse: (!) 102 (!) 103  Resp: 12 13  Temp:    SpO2: 92% 94%     General: Awake, interactive  CV:  Tachycardic with regular rhythm, normal peripheral perfusion Resp:  Lungs clear, unlabored respirations.  Abd:  Soft, nondistended, mild tenderness to palpation Neuro:  Alert and oriented, normal extraocular movements, symmetric facial movement, sensation intact over bilateral upper and lower extremities with 5 out of 5 strength.  Normal finger-to-nose  testing.   ED Results / Procedures / Treatments   Labs (all labs ordered are listed, but only abnormal results are displayed) Labs Reviewed  BASIC METABOLIC PANEL - Abnormal; Notable for the following components:      Result Value   Chloride 112 (*)    CO2 15 (*)    Glucose, Bld 123 (*)    All other components within normal limits  LIPASE, BLOOD - Abnormal; Notable for the following components:   Lipase 59 (*)    All other components within normal limits  HEPATIC FUNCTION PANEL - Abnormal; Notable for the following components:   Albumin 3.4 (*)    All other components within normal limits  URINALYSIS, W/ REFLEX TO CULTURE (INFECTION SUSPECTED) - Abnormal; Notable for the following components:   Color, Urine AMBER (*)    APPearance HAZY (*)    Hgb urine dipstick SMALL (*)    Ketones, ur 20 (*)    Protein, ur 30 (*)    Bacteria, UA RARE (*)    All other components within normal limits  CBC  VALPROIC ACID LEVEL  POC URINE PREG, ED     EKG EKG  independently reviewed interpreted by myself (ER attending) demonstrates:  EKG demonstrates sinus tachycardia at a rate of 130, PR 120, QRS 74, QTc 397, nonspecific ST changes noted, likely rate related  RADIOLOGY Imaging independently reviewed and interpreted by myself demonstrates:  CT head without acute bleed CTA of the chest without PE, radiology notes likely atelectasis CT Abdo pelvis without acute findings Chest x-Caige Almeda without focal consolidation, questionable pneumomediastinum noted, but confirmed to not be present on CTA of the chest  PROCEDURES:  Critical Care performed: No  Procedures   MEDICATIONS ORDERED IN ED: Medications  sodium chloride 0.9 % bolus 1,000 mL (0 mLs Intravenous Stopped 01/20/23 1858)  ondansetron (ZOFRAN) injection 4 mg (4 mg Intravenous Given 01/20/23 1647)  morphine (PF) 4 MG/ML injection 4 mg (4 mg Intravenous Given 01/20/23 1647)  iohexol (OMNIPAQUE) 350 MG/ML injection 100 mL (100 mLs  Intravenous Contrast Given 01/20/23 1720)  sodium chloride 0.9 % bolus 1,000 mL (1,000 mLs Intravenous New Bag/Given 01/20/23 1945)     IMPRESSION / MDM / ASSESSMENT AND PLAN / ED COURSE  I reviewed the triage vital signs and the nursing notes.  Differential diagnosis includes, but is not limited to, intracranial bleed, pneumonia, pneumothorax, pulmonary embolism, appendicitis, biliary pathology, other acute intra-abdominal process, arrhythmia, anemia, electrolyte abnormality  Patient's presentation is most consistent with acute presentation with potential threat to life or bodily function.  32 year old female presenting with generalized weakness with near syncopal for syncopal event.  Currently reporting headache, shortness of breath, abdominal pain.  She is tachycardic on arrival.  Labs sent from triage without severe derangement.  Will add on additional labs including a Depakote level as well as CT scans to further evaluate.  Will treat symptomatically with IV fluids, Zofran, morphine.  Labs overall reassuring.  Urine without evidence of infection, does note some ketones.  Suspect the patient may have become dehydrated in the setting of her nausea and decreased p.o. intake leading to her syncopal episode.  On reevaluation, her heart rate has improved significantly.  She is feeling much improved and tolerating p.o.  She does feel comfortable with discharge home.  Will DC with a prescription for Zofran.  Strict return precautions provided.  Patient discharged stable condition.    FINAL CLINICAL IMPRESSION(S) / ED DIAGNOSES   Final diagnoses:  Syncope and collapse  Nausea     Rx / DC Orders   ED Discharge Orders          Ordered    ondansetron (ZOFRAN) 4 MG tablet  Every 6 hours PRN        01/20/23 2038             Note:  This document was prepared using Dragon voice recognition software and may include unintentional dictation errors.   Trinna Post, MD 01/21/23 [phone]

## 2023-01-20 NOTE — ED Triage Notes (Signed)
Pt to ED with group home [facility] for fall at park, staff reports she has not been feeling well lately. C/o abd pain and h/a. +shob
